# Patient Record
Sex: Female | Born: 1950 | Race: Black or African American | Hispanic: No | Marital: Married | Smoking: Never smoker
Health system: Southern US, Community
[De-identification: ages and names within clinical notes are randomized; demographics above are authoritative.]

## PROBLEM LIST (undated history)

## (undated) DIAGNOSIS — C801 Malignant (primary) neoplasm, unspecified: Secondary | ICD-10-CM

## (undated) DIAGNOSIS — N393 Stress incontinence (female) (male): Secondary | ICD-10-CM

## (undated) DIAGNOSIS — IMO0001 Reserved for inherently not codable concepts without codable children: Secondary | ICD-10-CM

## (undated) DIAGNOSIS — M1990 Unspecified osteoarthritis, unspecified site: Secondary | ICD-10-CM

## (undated) DIAGNOSIS — I10 Essential (primary) hypertension: Secondary | ICD-10-CM

## (undated) DIAGNOSIS — K219 Gastro-esophageal reflux disease without esophagitis: Secondary | ICD-10-CM

## (undated) HISTORY — PX: ABDOMINAL SURGERY: SHX537

## (undated) HISTORY — PX: OTHER SURGICAL HISTORY: SHX169

## (undated) HISTORY — PX: ABDOMINAL HYSTERECTOMY: SHX81

## (undated) HISTORY — PX: COLOSTOMY: SHX63

---

## 2005-02-22 ENCOUNTER — Ambulatory Visit (HOSPITAL_COMMUNITY): Admission: RE | Admit: 2005-02-22 | Discharge: 2005-02-22 | Payer: Self-pay | Admitting: Ophthalmology

## 2005-02-22 ENCOUNTER — Ambulatory Visit (HOSPITAL_BASED_OUTPATIENT_CLINIC_OR_DEPARTMENT_OTHER): Admission: RE | Admit: 2005-02-22 | Discharge: 2005-02-22 | Payer: Self-pay | Admitting: Ophthalmology

## 2011-10-18 ENCOUNTER — Emergency Department (HOSPITAL_BASED_OUTPATIENT_CLINIC_OR_DEPARTMENT_OTHER)
Admission: EM | Admit: 2011-10-18 | Discharge: 2011-10-18 | Disposition: A | Discharge status: Home / Self Care | Payer: No Typology Code available for payment source | Attending: Emergency Medicine | Admitting: Emergency Medicine

## 2011-10-18 ENCOUNTER — Emergency Department (INDEPENDENT_AMBULATORY_CARE_PROVIDER_SITE_OTHER): Payer: No Typology Code available for payment source

## 2011-10-18 ENCOUNTER — Encounter (HOSPITAL_BASED_OUTPATIENT_CLINIC_OR_DEPARTMENT_OTHER): Payer: Self-pay | Admitting: *Deleted

## 2011-10-18 DIAGNOSIS — I10 Essential (primary) hypertension: Secondary | ICD-10-CM | POA: Insufficient documentation

## 2011-10-18 DIAGNOSIS — S139XXA Sprain of joints and ligaments of unspecified parts of neck, initial encounter: Secondary | ICD-10-CM | POA: Insufficient documentation

## 2011-10-18 DIAGNOSIS — R42 Dizziness and giddiness: Secondary | ICD-10-CM

## 2011-10-18 DIAGNOSIS — M549 Dorsalgia, unspecified: Secondary | ICD-10-CM | POA: Insufficient documentation

## 2011-10-18 DIAGNOSIS — S161XXA Strain of muscle, fascia and tendon at neck level, initial encounter: Secondary | ICD-10-CM

## 2011-10-18 DIAGNOSIS — Y92410 Unspecified street and highway as the place of occurrence of the external cause: Secondary | ICD-10-CM | POA: Insufficient documentation

## 2011-10-18 DIAGNOSIS — R110 Nausea: Secondary | ICD-10-CM | POA: Insufficient documentation

## 2011-10-18 HISTORY — DX: Essential (primary) hypertension: I10

## 2011-10-18 HISTORY — DX: Malignant (primary) neoplasm, unspecified: C80.1

## 2011-10-18 MED ORDER — HYDROCODONE-ACETAMINOPHEN 5-325 MG PO TABS: 1.0000 | ORAL_TABLET | Freq: Four times a day (QID) | ORAL | Status: AC | PRN

## 2011-10-18 MED ORDER — METHOCARBAMOL 500 MG PO TABS: 500.0000 mg | ORAL_TABLET | Freq: Two times a day (BID) | ORAL | Status: AC
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
## 2012-01-26 ENCOUNTER — Encounter (HOSPITAL_BASED_OUTPATIENT_CLINIC_OR_DEPARTMENT_OTHER): Payer: Self-pay | Admitting: *Deleted

## 2012-01-26 ENCOUNTER — Emergency Department (INDEPENDENT_AMBULATORY_CARE_PROVIDER_SITE_OTHER): Payer: BC Managed Care – PPO

## 2012-01-26 ENCOUNTER — Emergency Department (HOSPITAL_BASED_OUTPATIENT_CLINIC_OR_DEPARTMENT_OTHER)
Admission: EM | Admit: 2012-01-26 | Discharge: 2012-01-26 | Disposition: A | Discharge status: Home / Self Care | Payer: BC Managed Care – PPO | Attending: Emergency Medicine | Admitting: Emergency Medicine

## 2012-01-26 DIAGNOSIS — I10 Essential (primary) hypertension: Secondary | ICD-10-CM | POA: Insufficient documentation

## 2012-01-26 DIAGNOSIS — W230XXA Caught, crushed, jammed, or pinched between moving objects, initial encounter: Secondary | ICD-10-CM

## 2012-01-26 DIAGNOSIS — K219 Gastro-esophageal reflux disease without esophagitis: Secondary | ICD-10-CM | POA: Insufficient documentation

## 2012-01-26 DIAGNOSIS — S90129A Contusion of unspecified lesser toe(s) without damage to nail, initial encounter: Secondary | ICD-10-CM | POA: Insufficient documentation

## 2012-01-26 DIAGNOSIS — IMO0002 Reserved for concepts with insufficient information to code with codable children: Secondary | ICD-10-CM | POA: Insufficient documentation

## 2012-01-26 DIAGNOSIS — M79609 Pain in unspecified limb: Secondary | ICD-10-CM

## 2012-01-26 DIAGNOSIS — Y92009 Unspecified place in unspecified non-institutional (private) residence as the place of occurrence of the external cause: Secondary | ICD-10-CM | POA: Insufficient documentation

## 2012-01-26 HISTORY — DX: Reserved for inherently not codable concepts without codable children: IMO0001

## 2012-01-26 HISTORY — DX: Stress incontinence (female) (male): N39.3

## 2012-01-26 HISTORY — DX: Gastro-esophageal reflux disease without esophagitis: K21.9

## 2012-01-26 NOTE — ED Notes (Signed)
Patient states she slip the shower door over her left great toe last night.  C/O pain, swelling and bruising under toenail.

## 2012-01-26 NOTE — Discharge Instructions (Signed)

## 2012-01-26 NOTE — ED Provider Notes (Signed)
History     CSN: [id number]  Arrival date & time 01/26/12  1034   First MD Initiated Contact with Patient 01/26/12 1056      Chief Complaint  Patient presents with  . Toe Injury    left great toe    (Consider location/radiation/quality/duration/timing/severity/associated sxs/prior treatment) HPI Comments: Patient presents today with pain to her left big toe. She states the last time she was taking a shower the shower door slipped onto it. She is complaining of pain to her big toe since then. She says it difficult to walk due to pain. She states it was worse last night so, but better this morning. She denies any other injuries. Her last tetanus shot was within the last 5 years  The history is provided by the patient.    Past Medical History  Diagnosis Date  . Hypertension   . Cancer   . Reflux   . Urinary, incontinence, stress female     Past Surgical History  Procedure Date  . Colostomy   . Kidney stent   . Abdominal surgery   . Abdominal hysterectomy     No family history on file.  History  Substance Use Topics  . Smoking status: Never Smoker   . Smokeless tobacco: Not on file  . Alcohol Use: No    OB History    Grav Para Term Preterm Abortions TAB SAB Ect Mult Living                  Review of Systems  Constitutional: Negative for fever.  HENT: Negative for neck pain.   Gastrointestinal: Negative for nausea and vomiting.  Musculoskeletal: Positive for joint swelling. Negative for back pain.  Skin: Negative for wound.    Allergies  Imodium  Home Medications   Current Outpatient Rx  Name Route Sig Dispense Refill  . DEXLANSOPRAZOLE 60 MG PO CPDR Oral Take 60 mg by mouth daily.    Marland Kitchen HYDROCHLOROTHIAZIDE 25 MG PO TABS Oral Take 25 mg by mouth daily.      BP 135/75  Pulse 64  Temp(Src) 98.2 F (36.8 C) (Oral)  Resp 18  Ht 5\' 4"  (1.626 m)  Wt 110 lb (49.896 kg)  BMI 18.88 kg/m2  SpO2 100%  Physical Exam  Constitutional: She appears  well-developed and well-nourished.  HENT:  Head: Normocephalic and atraumatic.  Musculoskeletal:       Patient with mild swelling around the distal left first toe. There is some tenderness primarily over the distal phalanx. She is able to flex and extend the toe without problem. She has normal sensation to light touch distally capillary refill is less than 2 distally. She has a small subungual hematoma under the nail, encompassing less than one quarter of the nail. No other wounds are noted  Skin: Skin is warm and dry.    ED Course  Procedures (including critical care time)  No results found for this or any previous visit. Dg Foot Complete Left  01/26/2012  *RADIOLOGY REPORT*  Clinical Data: Crush injury to left foot.  LEFT FOOT - COMPLETE 3+ VIEW  Comparison: None.  Findings: No evidence for fracture.  No subluxation or dislocation. No worrisome lytic or sclerotic osseous abnormality.  IMPRESSION: No bony findings to explain the patient's history of pain.  Original Report Authenticated By: ERIC A. MANSELL, M.D.     1. Toe contusion       MDM  No fracture.  Will buddy tape, give post op shoe.  Pt  denies the need for pain meds. TDAP utd        Rolan Bucco, MD 01/26/12 1147

## 2012-06-15 ENCOUNTER — Encounter (HOSPITAL_BASED_OUTPATIENT_CLINIC_OR_DEPARTMENT_OTHER): Payer: Self-pay | Admitting: *Deleted

## 2012-06-15 ENCOUNTER — Emergency Department (HOSPITAL_BASED_OUTPATIENT_CLINIC_OR_DEPARTMENT_OTHER)
Admission: EM | Admit: 2012-06-15 | Discharge: 2012-06-15 | Disposition: A | Discharge status: Home / Self Care | Payer: No Typology Code available for payment source | Attending: Emergency Medicine | Admitting: Emergency Medicine

## 2012-06-15 DIAGNOSIS — T148XXA Other injury of unspecified body region, initial encounter: Secondary | ICD-10-CM

## 2012-06-15 DIAGNOSIS — M25519 Pain in unspecified shoulder: Secondary | ICD-10-CM | POA: Insufficient documentation

## 2012-06-15 DIAGNOSIS — M79609 Pain in unspecified limb: Secondary | ICD-10-CM | POA: Insufficient documentation

## 2012-06-15 DIAGNOSIS — Z859 Personal history of malignant neoplasm, unspecified: Secondary | ICD-10-CM | POA: Insufficient documentation

## 2012-06-15 DIAGNOSIS — Z043 Encounter for examination and observation following other accident: Secondary | ICD-10-CM | POA: Insufficient documentation

## 2012-06-15 DIAGNOSIS — K219 Gastro-esophageal reflux disease without esophagitis: Secondary | ICD-10-CM | POA: Insufficient documentation

## 2012-06-15 DIAGNOSIS — I10 Essential (primary) hypertension: Secondary | ICD-10-CM | POA: Insufficient documentation

## 2012-06-15 MED ORDER — HYDROCODONE-ACETAMINOPHEN 5-325 MG PO TABS: 1.0000 | ORAL_TABLET | ORAL | Status: AC | PRN

## 2012-06-15 MED ORDER — IBUPROFEN 600 MG PO TABS: 600.0000 mg | ORAL_TABLET | Freq: Four times a day (QID) | ORAL | Status: AC | PRN
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
## 2013-09-18 ENCOUNTER — Emergency Department (HOSPITAL_BASED_OUTPATIENT_CLINIC_OR_DEPARTMENT_OTHER): Payer: BC Managed Care – PPO

## 2013-09-18 ENCOUNTER — Encounter (HOSPITAL_BASED_OUTPATIENT_CLINIC_OR_DEPARTMENT_OTHER): Payer: Self-pay | Admitting: Emergency Medicine

## 2013-09-18 ENCOUNTER — Emergency Department (HOSPITAL_BASED_OUTPATIENT_CLINIC_OR_DEPARTMENT_OTHER)
Admission: EM | Admit: 2013-09-18 | Discharge: 2013-09-18 | Disposition: A | Discharge status: Home / Self Care | Payer: BC Managed Care – PPO | Attending: Emergency Medicine | Admitting: Emergency Medicine

## 2013-09-18 DIAGNOSIS — M79609 Pain in unspecified limb: Secondary | ICD-10-CM | POA: Diagnosis present

## 2013-09-18 DIAGNOSIS — Z923 Personal history of irradiation: Secondary | ICD-10-CM | POA: Insufficient documentation

## 2013-09-18 DIAGNOSIS — M129 Arthropathy, unspecified: Secondary | ICD-10-CM | POA: Diagnosis not present

## 2013-09-18 DIAGNOSIS — M79604 Pain in right leg: Secondary | ICD-10-CM

## 2013-09-18 DIAGNOSIS — IMO0001 Reserved for inherently not codable concepts without codable children: Secondary | ICD-10-CM | POA: Diagnosis not present

## 2013-09-18 DIAGNOSIS — Z79899 Other long term (current) drug therapy: Secondary | ICD-10-CM | POA: Diagnosis not present

## 2013-09-18 DIAGNOSIS — K219 Gastro-esophageal reflux disease without esophagitis: Secondary | ICD-10-CM | POA: Diagnosis not present

## 2013-09-18 DIAGNOSIS — Z87448 Personal history of other diseases of urinary system: Secondary | ICD-10-CM | POA: Insufficient documentation

## 2013-09-18 DIAGNOSIS — R269 Unspecified abnormalities of gait and mobility: Secondary | ICD-10-CM | POA: Diagnosis not present

## 2013-09-18 DIAGNOSIS — Z8509 Personal history of malignant neoplasm of other digestive organs: Secondary | ICD-10-CM | POA: Insufficient documentation

## 2013-09-18 DIAGNOSIS — M549 Dorsalgia, unspecified: Secondary | ICD-10-CM | POA: Diagnosis not present

## 2013-09-18 DIAGNOSIS — I10 Essential (primary) hypertension: Secondary | ICD-10-CM | POA: Diagnosis not present

## 2013-09-18 HISTORY — DX: Unspecified osteoarthritis, unspecified site: M19.90

## 2013-09-18 LAB — URINALYSIS, ROUTINE W REFLEX MICROSCOPIC
Bilirubin Urine: NEGATIVE
Glucose, UA: NEGATIVE mg/dL
Leukocytes, UA: NEGATIVE
Nitrite: NEGATIVE
Specific Gravity, Urine: 1.022 (ref 1.005–1.030)
pH: 6 (ref 5.0–8.0)

## 2013-09-18 LAB — CBC WITH DIFFERENTIAL/PLATELET
Hemoglobin: 12 g/dL (ref 12.0–15.0)
Lymphocytes Relative: 21 % (ref 12–46)
Lymphs Abs: 1.5 10*3/uL (ref 0.7–4.0)
MCV: 92.9 fL (ref 78.0–100.0)
Monocytes Relative: 10 % (ref 3–12)
Neutrophils Relative %: 65 % (ref 43–77)
Platelets: 281 10*3/uL (ref 150–400)
RBC: 3.94 MIL/uL (ref 3.87–5.11)
WBC: 7.4 10*3/uL (ref 4.0–10.5)

## 2013-09-18 LAB — COMPREHENSIVE METABOLIC PANEL
ALT: 5 U/L (ref 0–35)
Alkaline Phosphatase: 77 U/L (ref 39–117)
CO2: 29 mEq/L (ref 19–32)
GFR calc Af Amer: 62 mL/min — ABNORMAL LOW (ref >90)
GFR calc non Af Amer: 53 mL/min — ABNORMAL LOW (ref >90)
Glucose, Bld: 95 mg/dL (ref 70–99)
Potassium: 3.5 mEq/L (ref 3.5–5.1)
Sodium: 138 mEq/L (ref 135–145)
Total Bilirubin: 0.2 mg/dL — ABNORMAL LOW (ref 0.3–1.2)

## 2013-09-18 MED ORDER — IBUPROFEN 800 MG PO TABS: 800.0000 mg | ORAL_TABLET | Freq: Three times a day (TID) | ORAL | Status: AC

## 2013-09-18 MED ORDER — HYDROCODONE-ACETAMINOPHEN 5-325 MG PO TABS: 2.0000 | ORAL_TABLET | ORAL | Status: AC | PRN

## 2013-09-18 MED ORDER — HYDROCODONE-ACETAMINOPHEN 5-325 MG PO TABS
2.0000 | ORAL_TABLET | Freq: Once | ORAL | Status: AC
  Administered 2013-09-18: 2 via ORAL
  Filled 2013-09-18: qty 2

## 2013-09-18 NOTE — ED Notes (Signed)
Family at bedside. 

## 2013-09-18 NOTE — ED Provider Notes (Signed)
CSN: [id number]     Arrival date & time 09/18/13  1603 History   First MD Initiated Contact with Patient 09/18/13 1628     Chief Complaint  Patient presents with  . Leg Pain   (Consider location/radiation/quality/duration/timing/severity/associated sxs/prior Treatment) HPI Comments: Patient states her entire right leg has been hurting her "24/7 since September". She saw the orthopedic doctor and was told that she had arthritis and has been taking Ultram. She denies any falls or injuries. She cannot pinpoint the pain. She states is diffuse in the leg and putting weight on it makes it worse. She denies any weakness, numbness or tingling. No bowel or bladder incontinence. She denies any falls. She denies any pain in her low back. She denies any sciatica type pain. She has a remote history of peritoneal cancer status post resection she last received radiation in may. She denies any skin change or fever or vomiting. She denies any urinary symptoms. She has some stress incontinence at baseline. No new incontinence.  The history is provided by the patient.    Past Medical History  Diagnosis Date  . Hypertension   . Cancer   . Reflux   . Urinary, incontinence, stress female   . Arthritis    Past Surgical History  Procedure Laterality Date  . Colostomy    . Kidney stent    . Abdominal surgery    . Abdominal hysterectomy     History reviewed. No pertinent family history. History  Substance Use Topics  . Smoking status: Never Smoker   . Smokeless tobacco: Not on file  . Alcohol Use: No   OB History   Grav Para Term Preterm Abortions TAB SAB Ect Mult Living                 Review of Systems  Constitutional: Negative for fever, activity change and appetite change.  Respiratory: Negative for cough, chest tightness and shortness of breath.   Cardiovascular: Negative for chest pain.  Gastrointestinal: Negative for nausea and abdominal pain.  Genitourinary: Negative for dysuria and  hematuria.  Musculoskeletal: Positive for arthralgias, back pain, gait problem and myalgias.  Skin: Negative for rash.  Neurological: Negative for dizziness, weakness and headaches.  A complete 10 system review of systems was obtained and all systems are negative except as noted in the HPI and PMH.    Allergies  Avelox and Imodium  Home Medications   Current Outpatient Rx  Name  Route  Sig  Dispense  Refill  . traMADol (ULTRAM) 50 MG tablet   Oral   Take by mouth every 6 (six) hours as needed.         Marland Kitchen dexlansoprazole (DEXILANT) 60 MG capsule   Oral   Take 60 mg by mouth daily.         . hydrochlorothiazide (HYDRODIURIL) 25 MG tablet   Oral   Take 25 mg by mouth daily.         Marland Kitchen HYDROcodone-acetaminophen (NORCO/VICODIN) 5-325 MG per tablet   Oral   Take 2 tablets by mouth every 4 (four) hours as needed.   10 tablet   0   . ibuprofen (ADVIL,MOTRIN) 800 MG tablet   Oral   Take 1 tablet (800 mg total) by mouth 3 (three) times daily.   21 tablet   0    BP 143/65  Pulse 79  Temp(Src) 98.7 F (37.1 C) (Oral)  Resp 18  SpO2 99% Physical Exam  Constitutional: She is oriented to person,  place, and time. She appears well-developed and well-nourished. No distress.  HENT:  Head: Normocephalic and atraumatic.  Mouth/Throat: Oropharynx is clear and moist. No oropharyngeal exudate.  Eyes: Conjunctivae and EOM are normal. Pupils are equal, round, and reactive to light.  Neck: Normal range of motion. Neck supple.  Cardiovascular: Normal rate, regular rhythm and normal heart sounds.   Pulmonary/Chest: Effort normal and breath sounds normal. No respiratory distress.  Abdominal: Soft. There is no tenderness. There is no rebound and no guarding.  Musculoskeletal: Normal range of motion. She exhibits no edema and no tenderness.  No asymmetry to the legs. There is full range of motion of right hip, knee and ankle without pain. Joints are not warm. No effusion. Intact DP and PT  pulses. Patient complains of tenderness palpation of posterior thigh and calf. No asymmetry. 5/5 strength in bilateral lower extremities. Ankle plantar and dorsiflexion intact. Great toe extension intact bilaterally. +2 DP and PT pulses. +2 patellar reflexes bilaterally. Normal gait.   Neurological: She is alert and oriented to person, place, and time. No cranial nerve deficit. She exhibits normal muscle tone. Coordination normal.  Skin: Skin is warm.    ED Course  Procedures (including critical care time) Labs Review Labs Reviewed  URINALYSIS, ROUTINE W REFLEX MICROSCOPIC - Abnormal; Notable for the following:    Ketones, ur 15 (*)    All other components within normal limits  COMPREHENSIVE METABOLIC PANEL - Abnormal; Notable for the following:    Total Bilirubin 0.2 (*)    GFR calc non Af Amer 53 (*)    GFR calc Af Amer 62 (*)    All other components within normal limits  CK  CBC WITH DIFFERENTIAL   Imaging Review Dg Hip Complete Right  09/18/2013   CLINICAL DATA:  Right leg pain.  EXAM: RIGHT HIP - COMPLETE 2+ VIEW  COMPARISON:  None.  FINDINGS: There is no fracture or dislocation or bone destruction. No arthritis.  Incidental note is made of surgical staples in the mid pelvis.  IMPRESSION: Normal right hip.   Electronically Signed   By: Geanie Cooley M.D.   On: 09/18/2013 17:17   US Venous Img Lower Unilateral Right  09/18/2013   CLINICAL DATA:  Right leg pain/swelling  EXAM: Right LOWER EXTREMITY VENOUS DOPPLER ULTRASOUND  TECHNIQUE: Gray-scale sonography with graded compression, as well as color Doppler and duplex ultrasound, were performed to evaluate the deep venous system from the level of the common femoral vein through the popliteal and proximal calf veins. Spectral Doppler was utilized to evaluate flow at rest and with distal augmentation maneuvers.  COMPARISON:  None.  FINDINGS: Visualized right lower extremity deep venous system appears patent.  Normal compressibility.  Patent color Doppler flow. Satisfactory spectral Doppler with respiratory variation and response to augmentation.  Greater saphenous vein, where visualized, is patent and compressible.  IMPRESSION: No deep venous thrombosis in the visualized right lower extremity.   Electronically Signed   By: Charline Bills M.D.   On: 09/18/2013 17:13   Dg Knee Complete 4 Views Right  09/18/2013   CLINICAL DATA:  Right leg pain.  EXAM: RIGHT KNEE - COMPLETE 4+ VIEW  COMPARISON:  None.  FINDINGS: There is no fracture or dislocation. There is diffuse osteopenia. No effusion. Very minimal marginal osteophytes on the medial and lateral aspects of the tibial plateaus.  IMPRESSION: Osteopenia.  Very minimal degenerative changes.   Electronically Signed   By: Geanie Cooley M.D.   On: 09/18/2013 17:16  EKG Interpretation   None       MDM   1. Leg pain, right    Right leg pain ongoing for the past 3 months of undetermined etiology. No focal weakness, numbness or tingling. She was told by orthopedics that she had arthritis. She endorses pain in the entire leg with no weakness, numbness, tingling.  No low back pain.   X-rays are negative. Dopplers negative for DVT. Urinalysis is negative. Electrolytes normal.   Patient has intact distal pulses. No evidence of acute ischemia. He is able to ambulate with a slight limp on the right. She states her pain is improved with pain medications. This pain has been ongoing for the past 3 months. She'll need followup with her PCP for further evaluation. Will discharge with a short course of pain medication.  Glynn Octave, MD 09/18/13 [phone]

## 2013-09-18 NOTE — ED Notes (Signed)
Pt amb to triage with quick steady gait in nad. Pt reports right leg pain x sept. Seen by ortho and dx with arthritis, was given meds, cont with pain.

## 2013-09-18 NOTE — ED Notes (Signed)
Pt. Is able to walk to rest room and in hall way.  Pt. Alert and oriented with no distress noted.

## 2015-04-03 ENCOUNTER — Encounter (HOSPITAL_BASED_OUTPATIENT_CLINIC_OR_DEPARTMENT_OTHER): Payer: Self-pay | Admitting: *Deleted

## 2015-04-03 ENCOUNTER — Emergency Department (HOSPITAL_BASED_OUTPATIENT_CLINIC_OR_DEPARTMENT_OTHER)
Admission: EM | Admit: 2015-04-03 | Discharge: 2015-04-03 | Disposition: A | Discharge status: Home / Self Care | Payer: BLUE CROSS/BLUE SHIELD | Attending: Emergency Medicine | Admitting: Emergency Medicine

## 2015-04-03 DIAGNOSIS — T63444A Toxic effect of venom of bees, undetermined, initial encounter: Secondary | ICD-10-CM | POA: Diagnosis not present

## 2015-04-03 DIAGNOSIS — M1990 Unspecified osteoarthritis, unspecified site: Secondary | ICD-10-CM | POA: Insufficient documentation

## 2015-04-03 DIAGNOSIS — Y998 Other external cause status: Secondary | ICD-10-CM | POA: Diagnosis not present

## 2015-04-03 DIAGNOSIS — Y9289 Other specified places as the place of occurrence of the external cause: Secondary | ICD-10-CM | POA: Insufficient documentation

## 2015-04-03 DIAGNOSIS — Z79899 Other long term (current) drug therapy: Secondary | ICD-10-CM | POA: Insufficient documentation

## 2015-04-03 DIAGNOSIS — K219 Gastro-esophageal reflux disease without esophagitis: Secondary | ICD-10-CM | POA: Diagnosis not present

## 2015-04-03 DIAGNOSIS — Y9389 Activity, other specified: Secondary | ICD-10-CM | POA: Diagnosis not present

## 2015-04-03 DIAGNOSIS — Z791 Long term (current) use of non-steroidal anti-inflammatories (NSAID): Secondary | ICD-10-CM | POA: Diagnosis not present

## 2015-04-03 DIAGNOSIS — I10 Essential (primary) hypertension: Secondary | ICD-10-CM | POA: Insufficient documentation

## 2015-04-03 DIAGNOSIS — Z8589 Personal history of malignant neoplasm of other organs and systems: Secondary | ICD-10-CM | POA: Insufficient documentation

## 2015-04-03 NOTE — Discharge Instructions (Signed)
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
## 2015-04-03 NOTE — ED Provider Notes (Signed)
CSN: [id number]     Arrival date & time 04/03/15  2108 History   First MD Initiated Contact with Patient 04/03/15 2130     Chief Complaint  Patient presents with  . Insect Bite     (Consider location/radiation/quality/duration/timing/severity/associated sxs/prior Treatment) Patient is a 64 y.o. female presenting with rash. The history is provided by the patient. No language interpreter was used.  Rash Location:  Full body Quality: itchiness, painful and redness   Pain details:    Severity:  Moderate   Timing:  Constant   Progression:  Worsening Severity:  Moderate Onset quality:  Gradual Duration:  2 hours Timing:  Constant Progression:  Worsening Chronicity:  New Relieved by:  Nothing Worsened by:  Nothing tried Ineffective treatments:  None tried Pt has multiple bee sting.  Past Medical History  Diagnosis Date  . Hypertension   . Cancer   . Reflux   . Urinary, incontinence, stress female   . Arthritis    Past Surgical History  Procedure Laterality Date  . Colostomy    . Kidney stent    . Abdominal surgery    . Abdominal hysterectomy     No family history on file. History  Substance Use Topics  . Smoking status: Never Smoker   . Smokeless tobacco: Not on file  . Alcohol Use: No   OB History    No data available     Review of Systems  Skin: Positive for rash.  All other systems reviewed and are negative.     Allergies  Avelox and Imodium  Home Medications   Prior to Admission medications   Medication Sig Start Date End Date Taking? Authorizing Provider  dexlansoprazole (DEXILANT) 60 MG capsule Take 60 mg by mouth daily.   Yes Historical Provider, MD  hydrochlorothiazide (HYDRODIURIL) 25 MG tablet Take 25 mg by mouth daily.   Yes Historical Provider, MD  oxycodone (OXY-IR) 5 MG capsule Take 5 mg by mouth every 4 (four) hours as needed.   Yes Historical Provider, MD  HYDROcodone-acetaminophen (NORCO/VICODIN) 5-325 MG per tablet Take 2 tablets by  mouth every 4 (four) hours as needed. 09/18/13   Ezequiel Essex, MD  ibuprofen (ADVIL,MOTRIN) 800 MG tablet Take 1 tablet (800 mg total) by mouth 3 (three) times daily. 09/18/13   Ezequiel Essex, MD  traMADol (ULTRAM) 50 MG tablet Take by mouth every 6 (six) hours as needed.    Historical Provider, MD   BP 147/83 mmHg  Pulse 98  Temp(Src) 97.6 F (36.4 C) (Oral)  Resp 18  Ht 5\' 2"  (1.575 m)  Wt 100 lb (45.36 kg)  BMI 18.29 kg/m2  SpO2 99% Physical Exam  Constitutional: She is oriented to person, place, and time. She appears well-developed and well-nourished.  HENT:  Head: Normocephalic.  Eyes: EOM are normal.  Neck: Normal range of motion.  Pulmonary/Chest: Effort normal.  Abdominal: She exhibits no distension.  Musculoskeletal: Normal range of motion.  Neurological: She is alert and oriented to person, place, and time.  Skin: There is erythema.  Erythematous areas right arm. Right foot, left upper lip  Psychiatric: She has a normal mood and affect.  Nursing note and vitals reviewed.  Procedure,  Stinger removed from bee sting on left upper lip with wooden end of qtip ED Course  Procedures (including critical care time) Labs Review Labs Reviewed - No data to display  Imaging Review No results found.   EKG Interpretation None       no rash, no  sign of allergic reaction   Final diagnoses:  Bee sting, undetermined intent, initial encounter    Benadryl Hydrocortisone avs    Fransico Meadow, PA-C 04/03/15 2148  Blanchie Dessert, MD 04/03/15 [phone]

## 2015-08-15 IMAGING — CR DG KNEE COMPLETE 4+V*R*
4 series · 4 of 4 positions shown · non-contrast
Comparison: None.

CLINICAL DATA: Right leg pain.

EXAM:
RIGHT KNEE - COMPLETE 4+ VIEW

[t knee ap right]
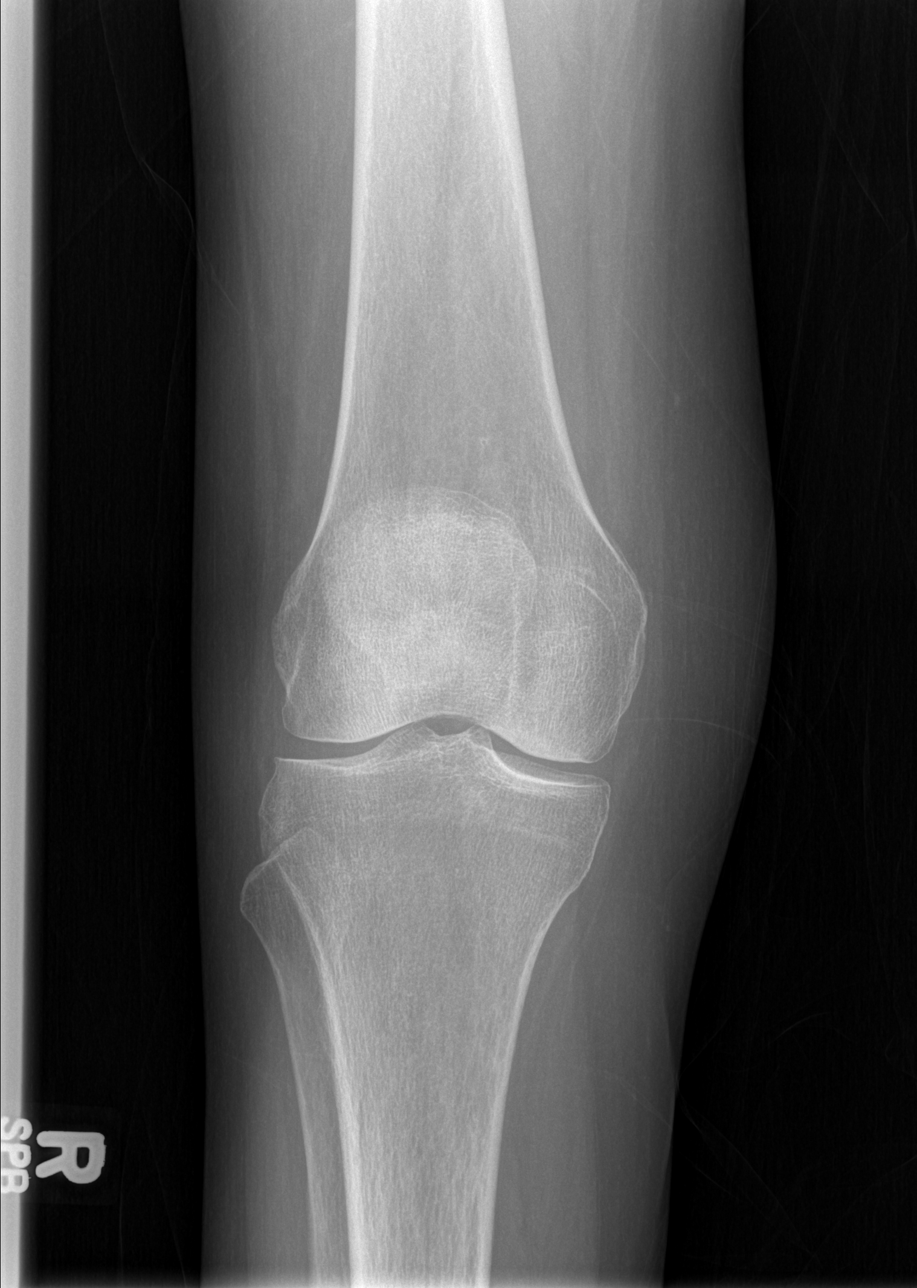

[t knee oblique right (1 of 2)]
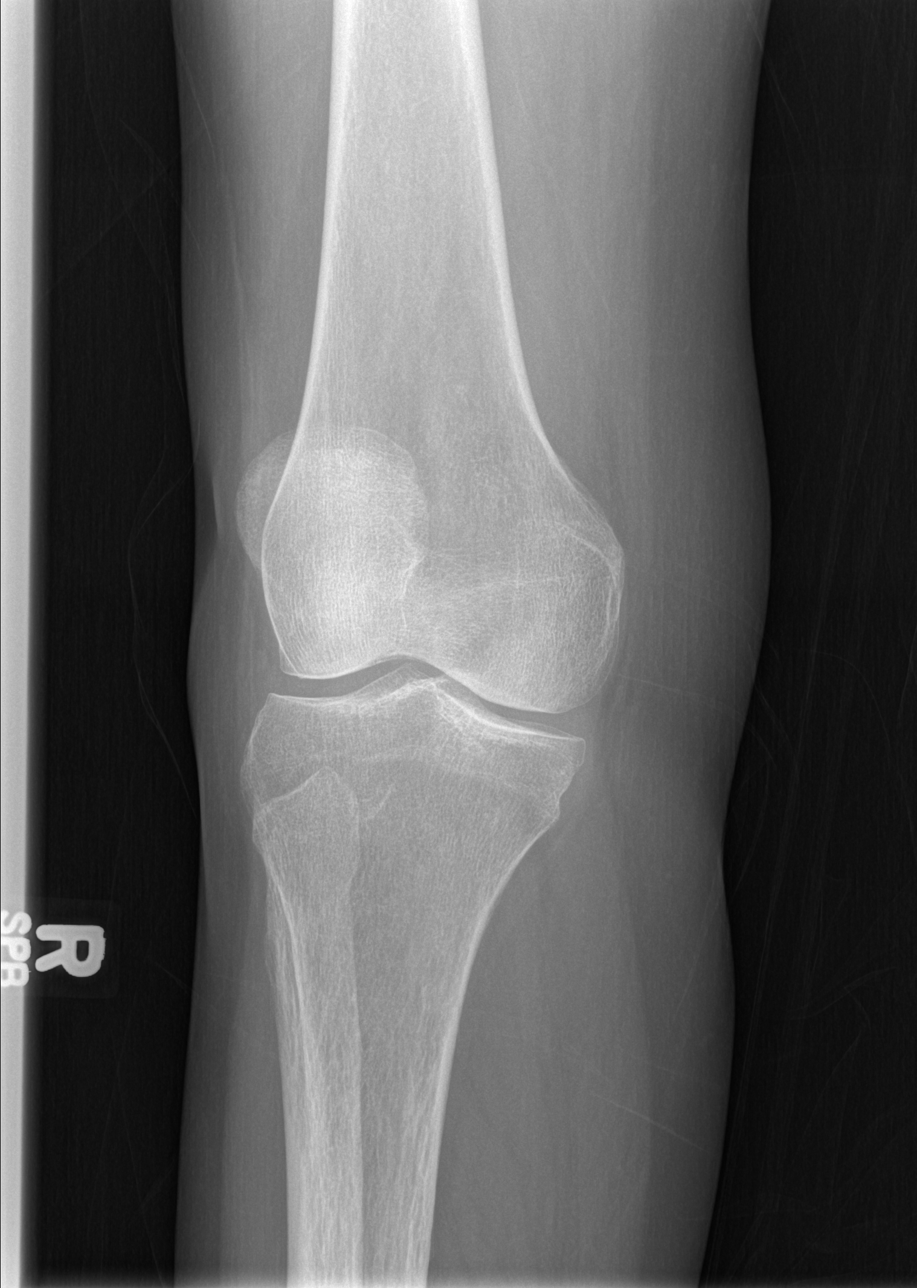

[t knee oblique right (2 of 2)]
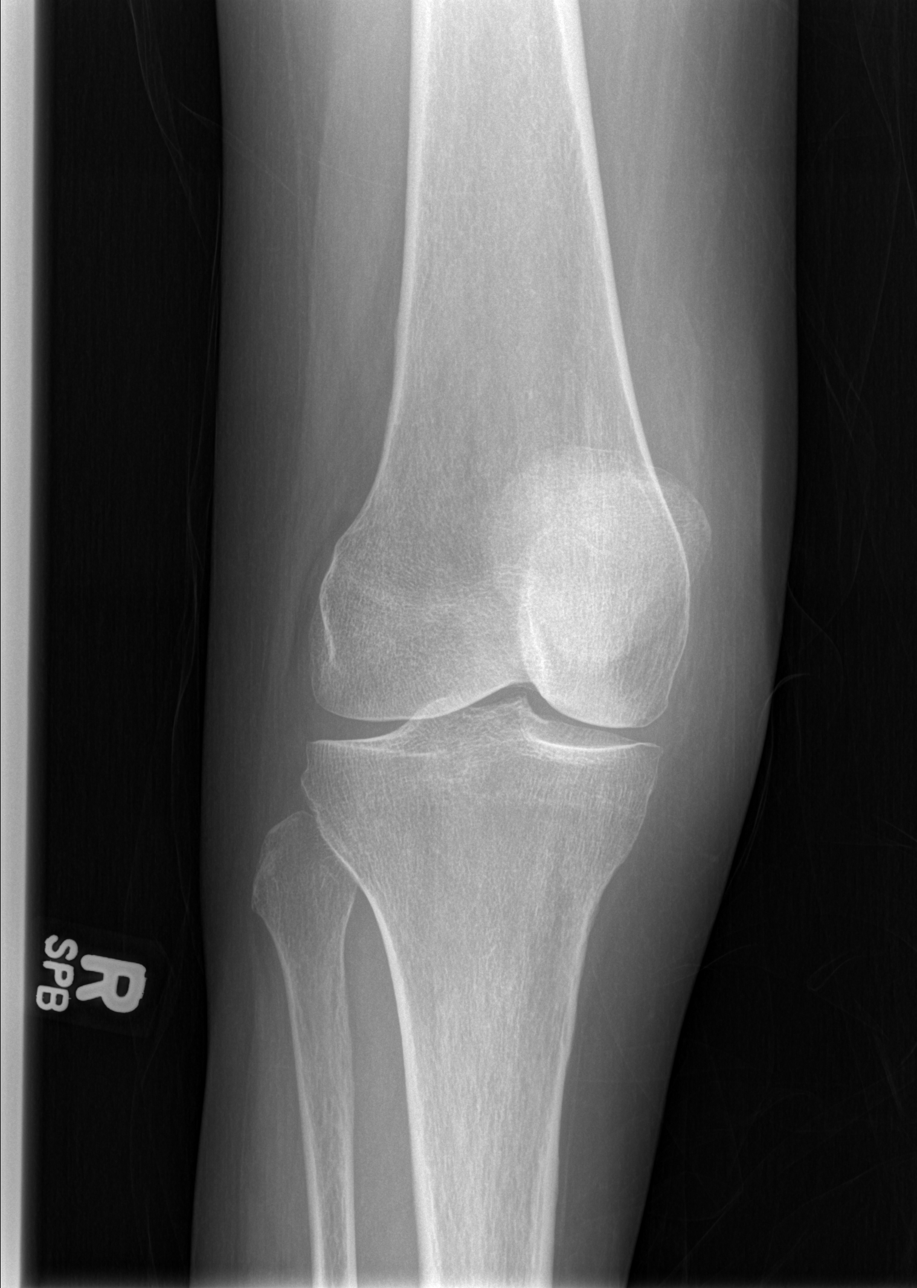

[t knee lat right]
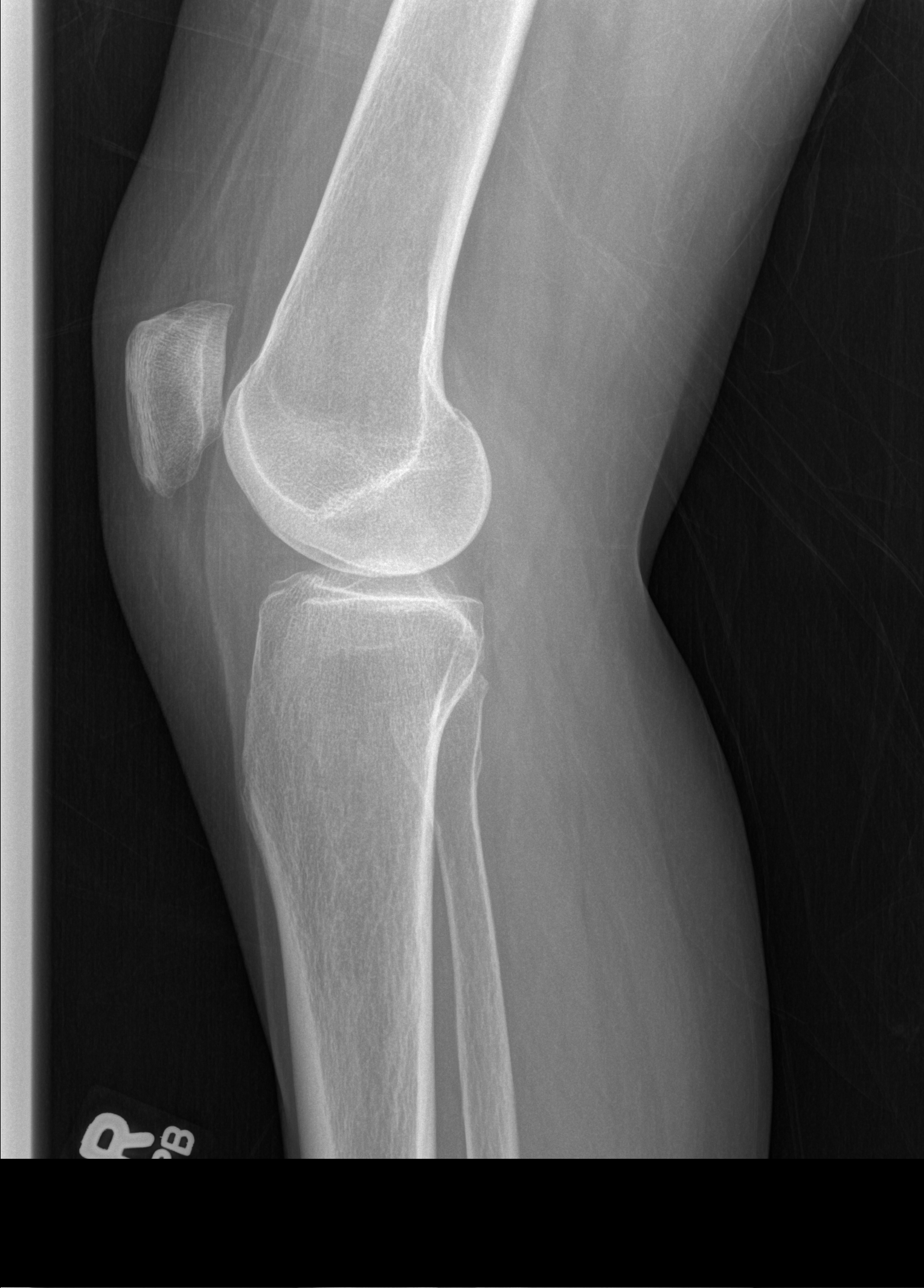

[4 of 4 positions shown; findings below may reference images not displayed]

FINDINGS: There is no fracture or dislocation. There is diffuse osteopenia. No
effusion. Very minimal marginal osteophytes on the medial and
lateral aspects of the tibial plateaus.
IMPRESSION: Osteopenia.  Very minimal degenerative changes.

## 2015-08-15 IMAGING — US US EXTREM LOW VENOUS*R*
1 series · 14 of 19 positions shown · non-contrast
Comparison: None.

CLINICAL DATA: Right leg pain/swelling

EXAM:
Right LOWER EXTREMITY VENOUS DOPPLER ULTRASOUND
TECHNIQUE: Gray-scale sonography with graded compression, as well as color
Doppler and duplex ultrasound, were performed to evaluate the deep
venous system from the level of the common femoral vein through the
popliteal and proximal calf veins. Spectral Doppler was utilized to
evaluate flow at rest and with distal augmentation maneuvers.

[Series 1: us extrem low venous*right* · 14 of 19 slices shown]
[im 1/19]
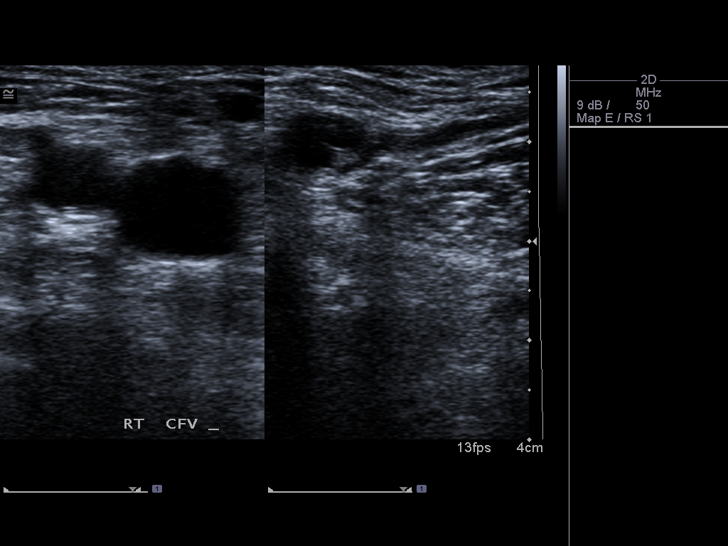
[im 3/19]
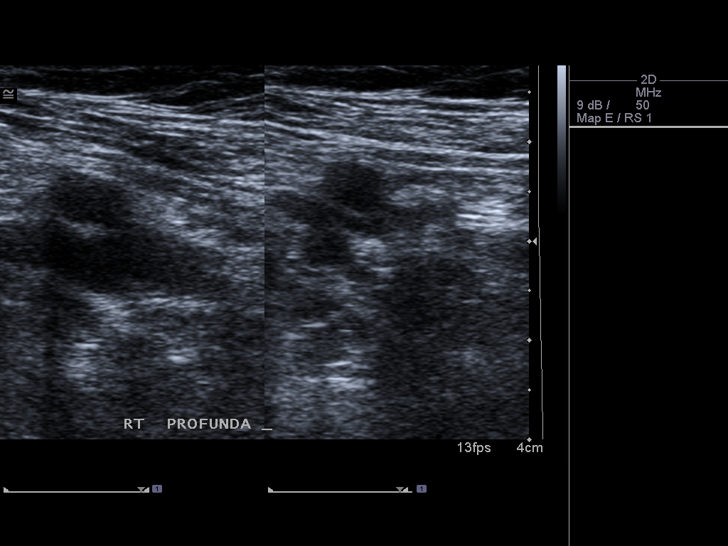
[im 4/19]
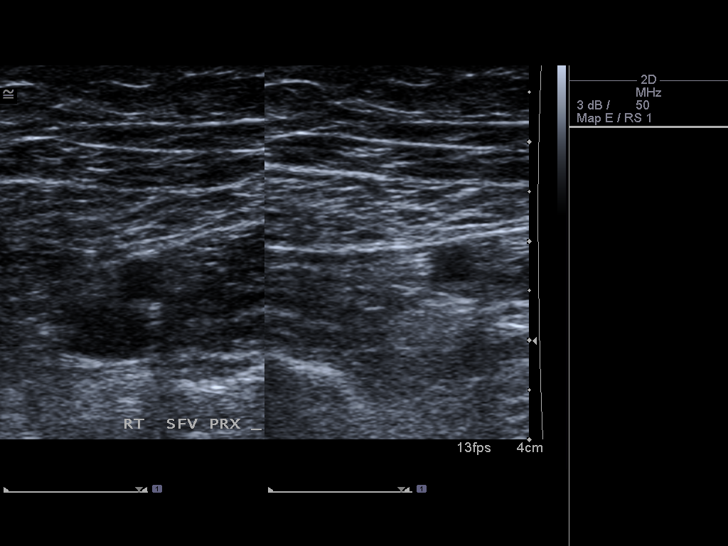
[im 5/19]
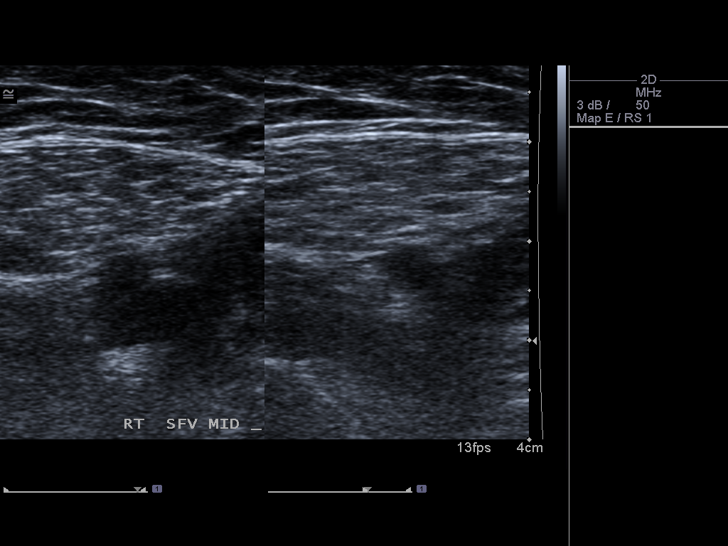
[im 7/19]
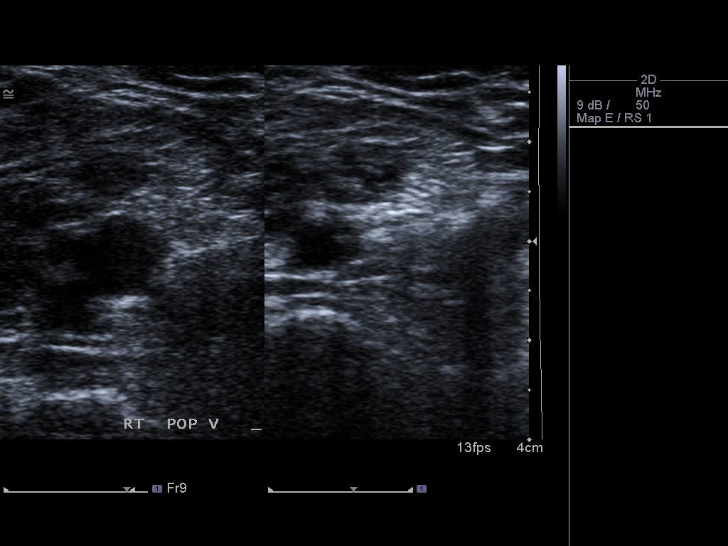
[im 8/19]
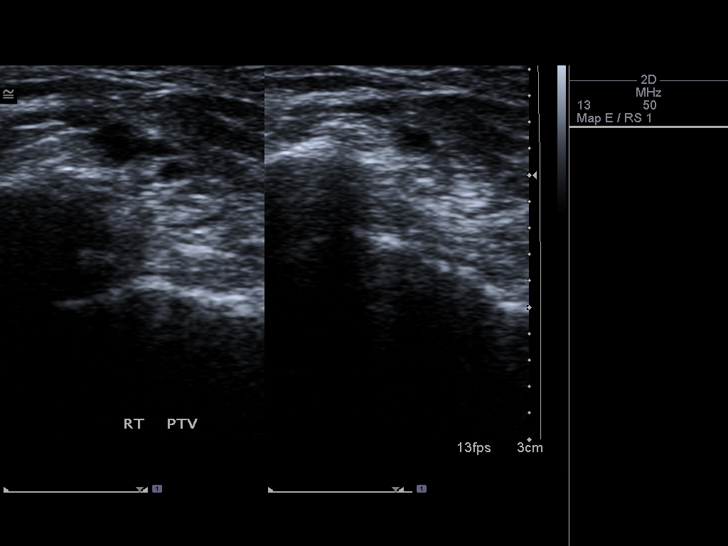
[im 9/19]
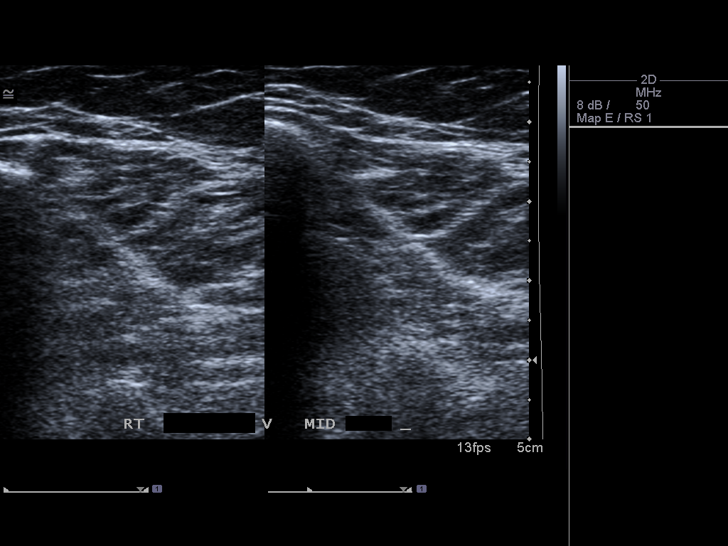
[im 11/19]
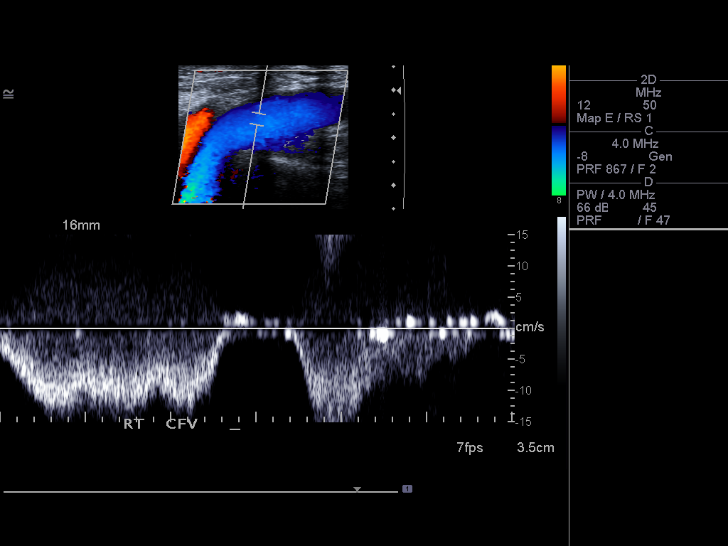
[im 12/19]
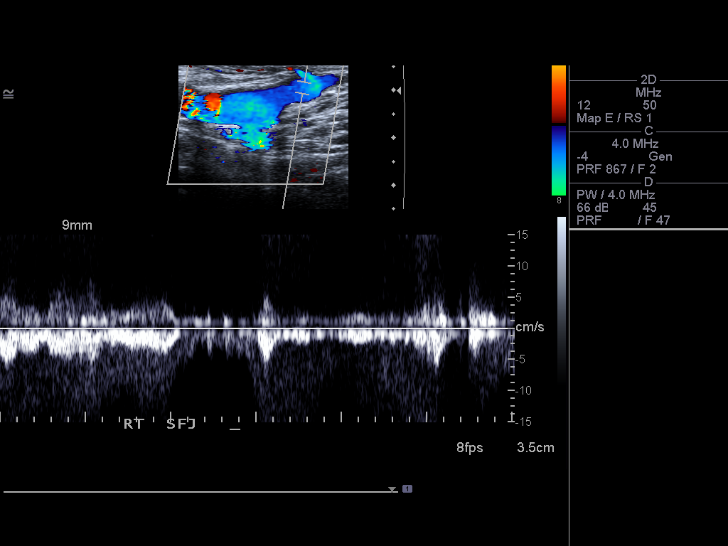
[im 13/19]
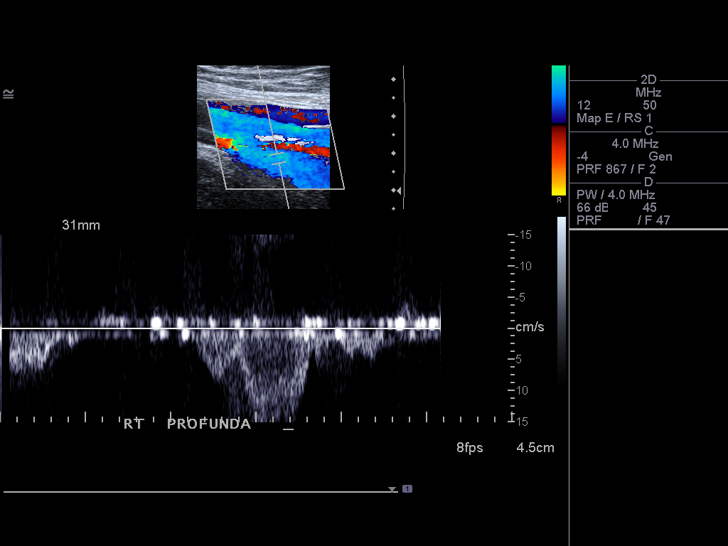
[im 15/19]
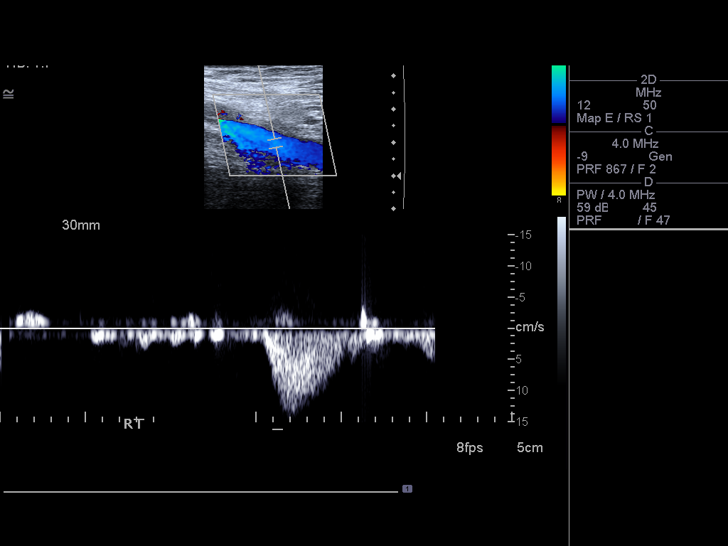
[im 16/19]
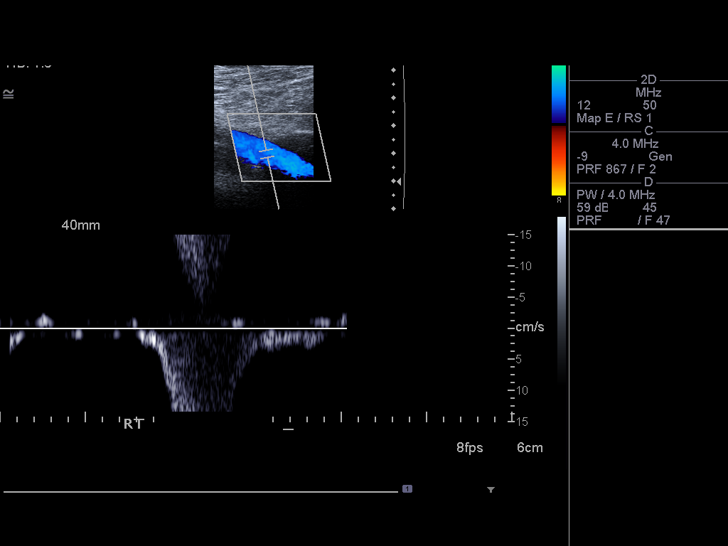
[im 17/19]
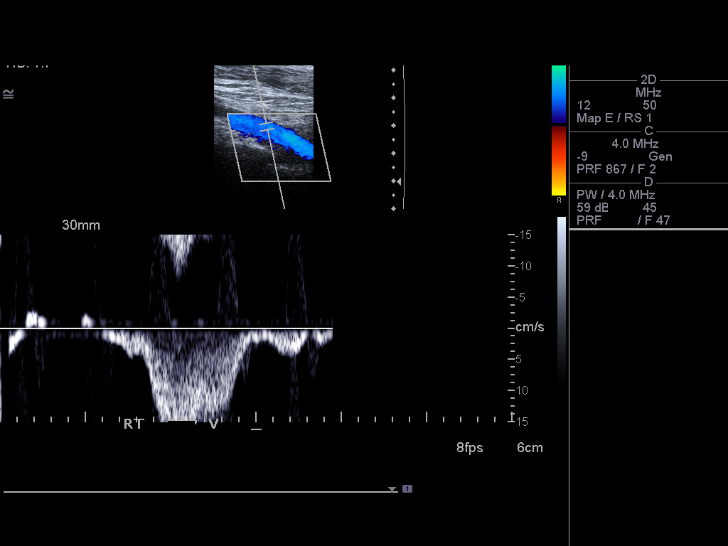
[im 19/19]
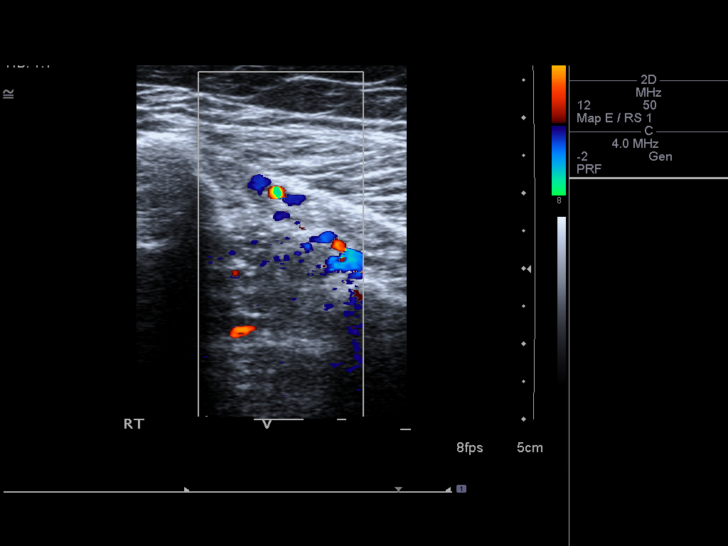

[14 of 19 positions shown; findings below may reference images not displayed]

FINDINGS: Visualized right lower extremity deep venous system appears patent.

Normal compressibility. Patent color Doppler flow. Satisfactory
spectral Doppler with respiratory variation and response to
augmentation.

Greater saphenous vein, where visualized, is patent and
compressible.
IMPRESSION: No deep venous thrombosis in the visualized right lower extremity.

## 2016-09-01 DEATH — deceased
